# Patient Record
Sex: Male | Born: 1995 | Race: Black or African American | Hispanic: No | Marital: Single | Smoking: Never smoker
Health system: Southern US, Community
[De-identification: ages and names within clinical notes are randomized; demographics above are authoritative.]

---
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
## 2015-10-12 IMAGING — CR DG CHEST 2V
2 series · 2 of 2 positions shown · non-contrast
Comparison: None.

CLINICAL DATA: Left chest pain

EXAM:
CHEST  2 VIEW

[w chest pa]
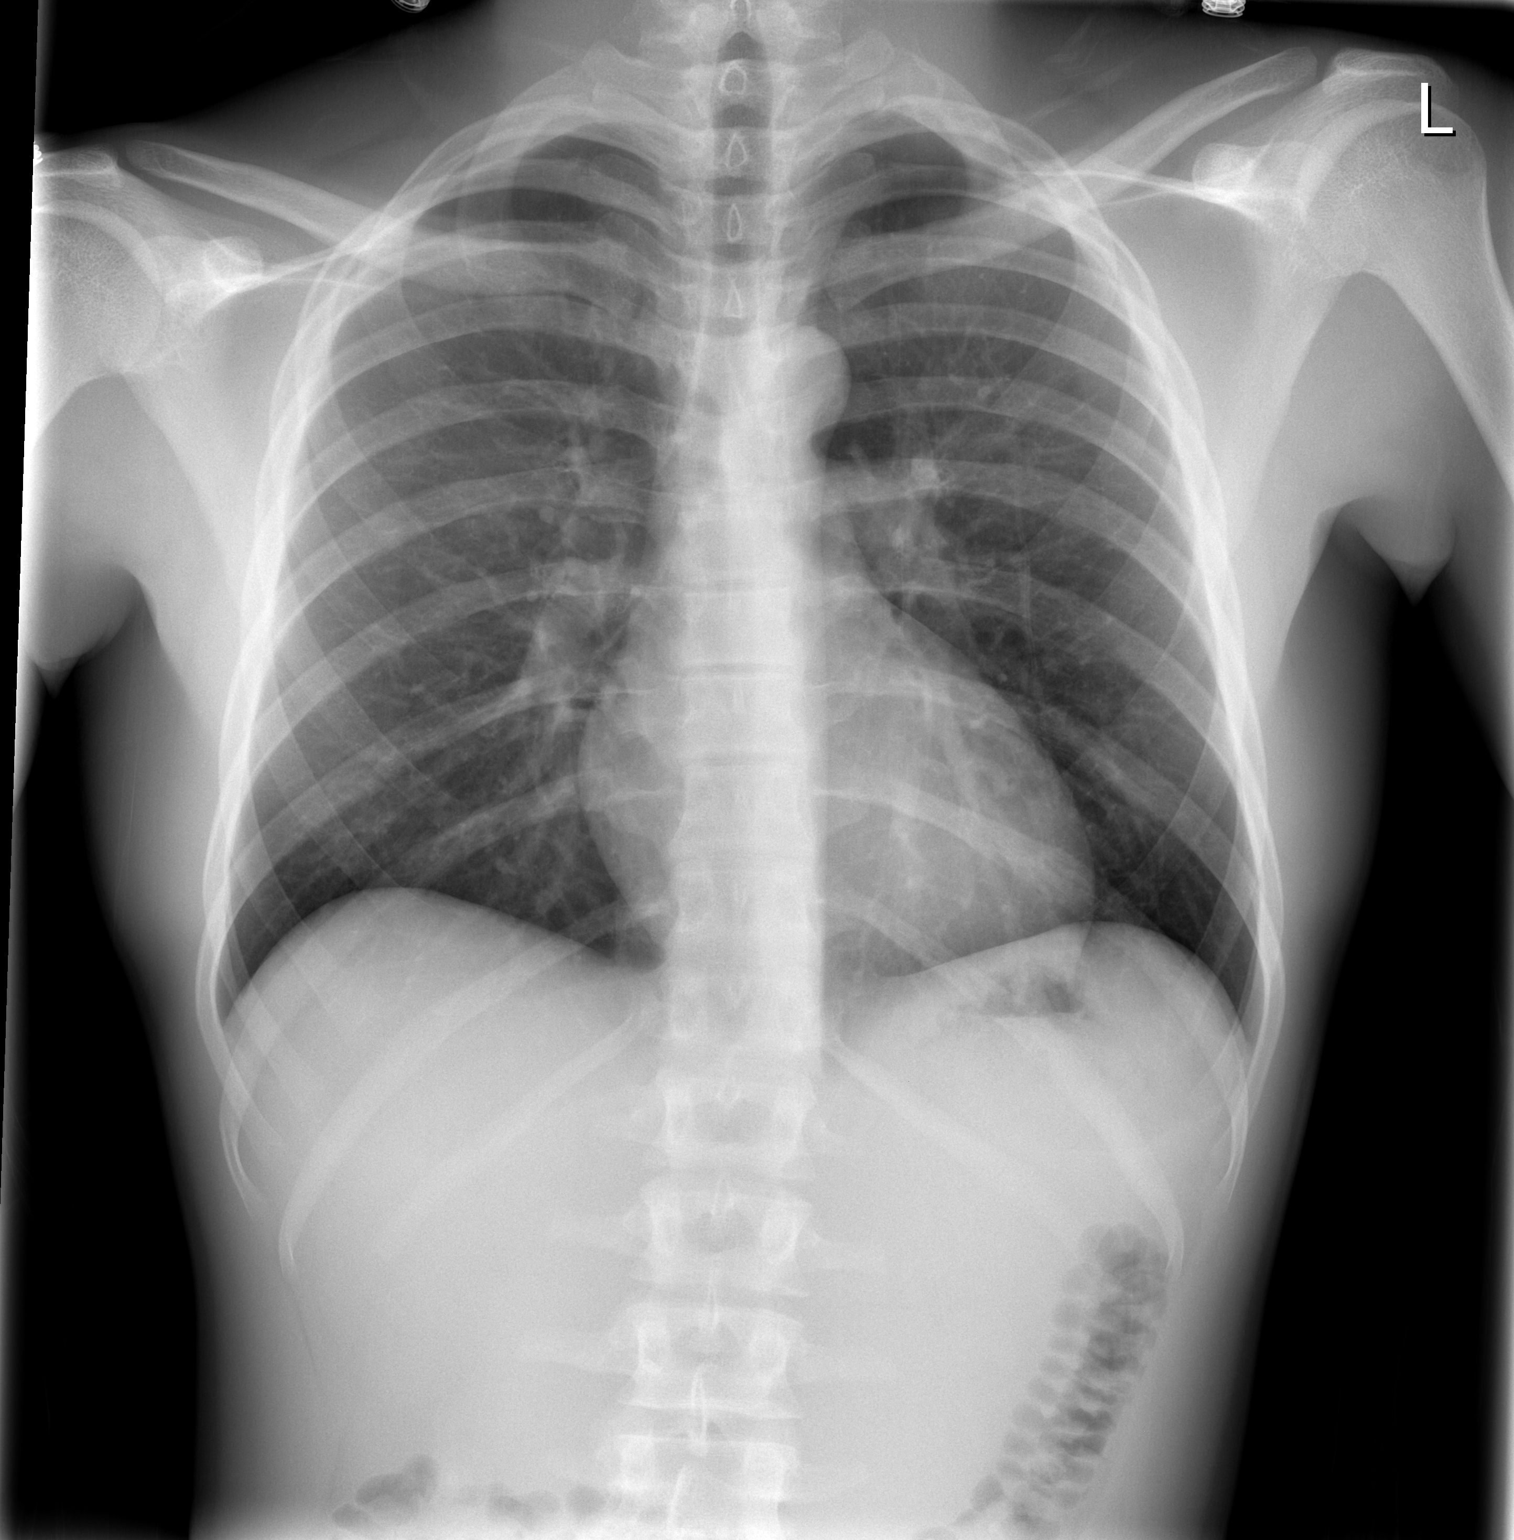

[w chest lat]
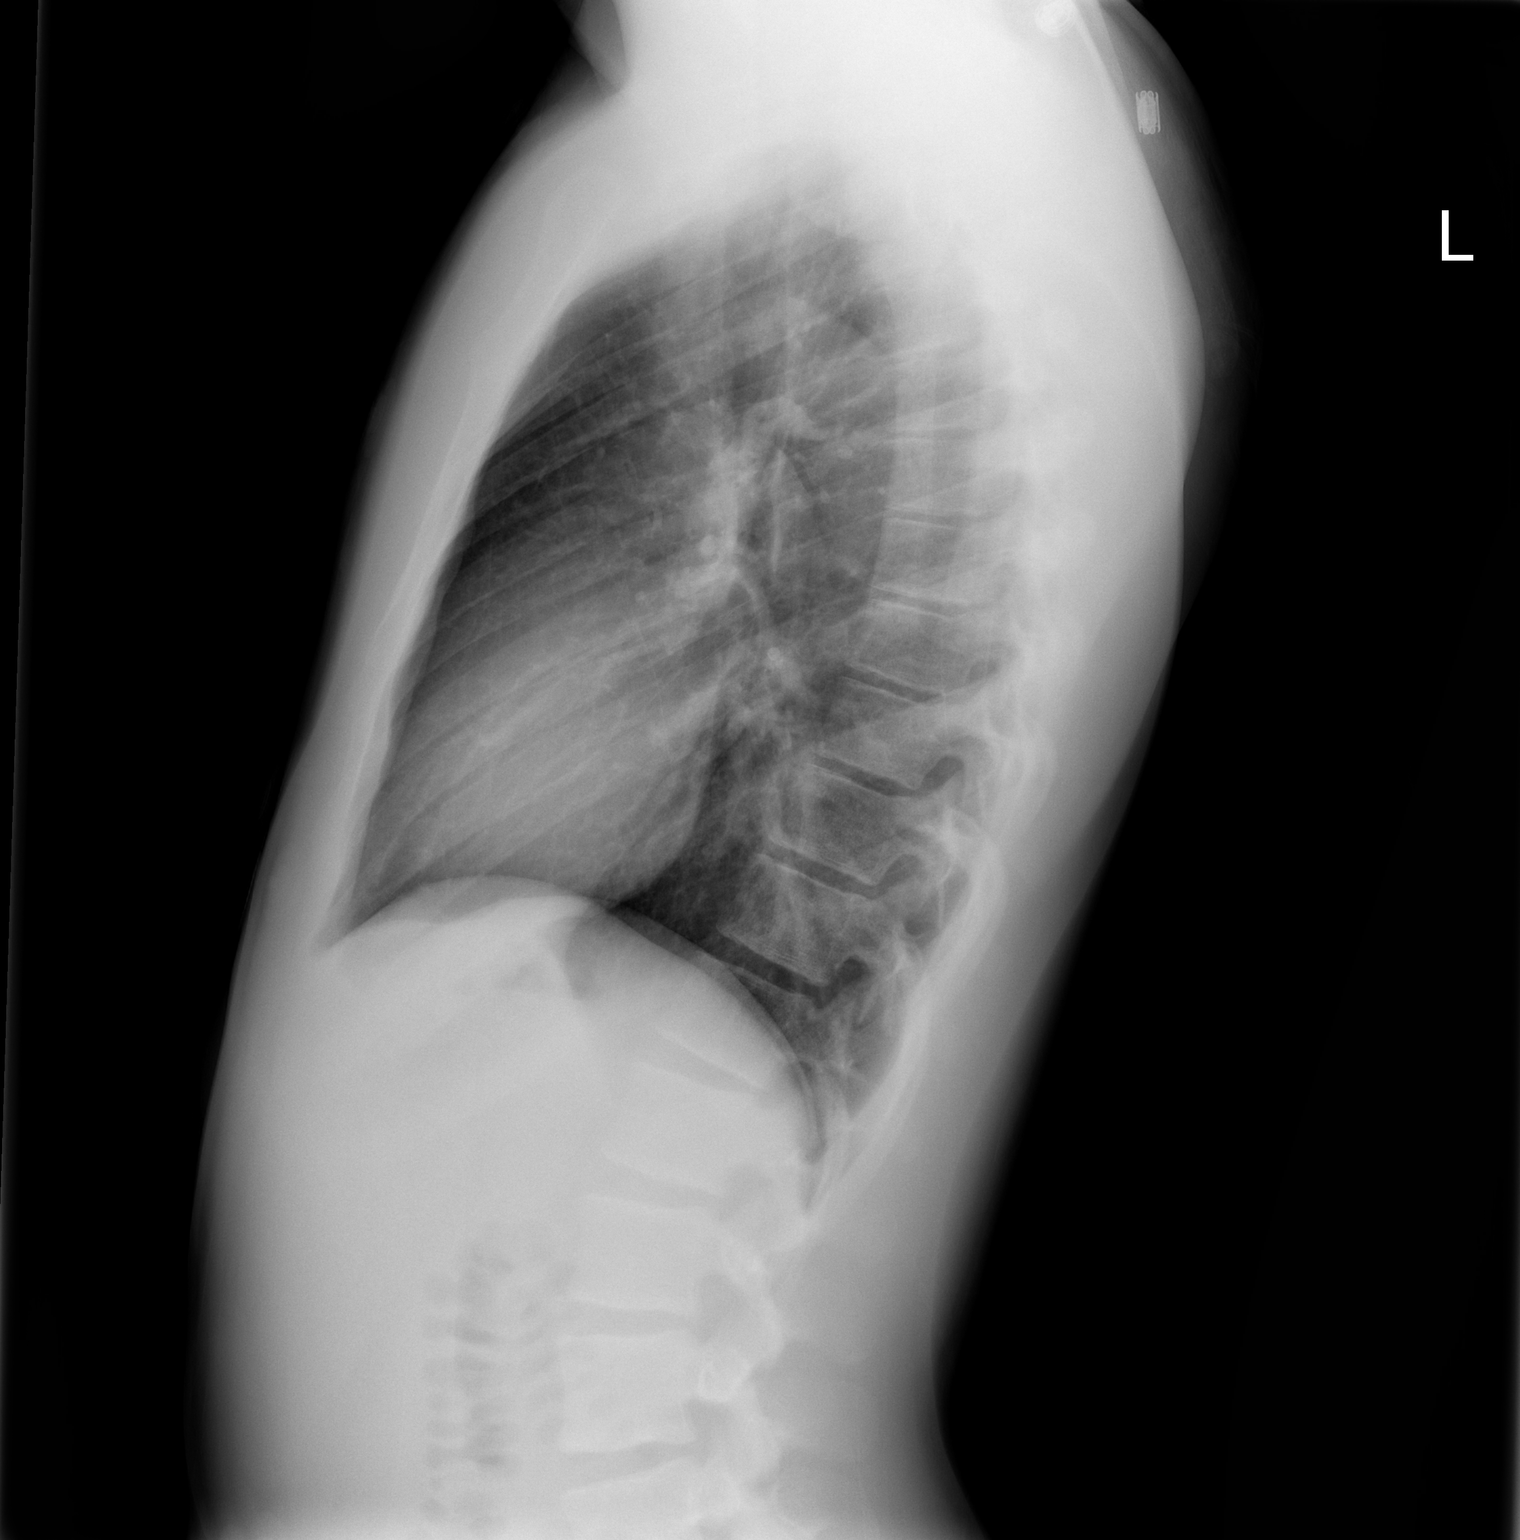

[2 of 2 positions shown; findings below may reference images not displayed]

FINDINGS: Lungs are clear. No pleural effusion or pneumothorax.

The heart is normal in size.

Visualized osseous structures are within normal limits.
IMPRESSION: Normal chest radiographs.

## 2020-08-18 ENCOUNTER — Other Ambulatory Visit: Payer: Self-pay

## 2020-08-18 ENCOUNTER — Emergency Department (HOSPITAL_COMMUNITY)
Admission: EM | Admit: 2020-08-18 | Discharge: 2020-08-18 | Disposition: A | Discharge status: Home / Self Care | Payer: Self-pay | Attending: Emergency Medicine | Admitting: Emergency Medicine

## 2020-08-18 ENCOUNTER — Encounter (HOSPITAL_COMMUNITY): Payer: Self-pay

## 2020-08-18 DIAGNOSIS — H5500 Unspecified nystagmus: Secondary | ICD-10-CM | POA: Insufficient documentation

## 2020-08-18 DIAGNOSIS — R519 Headache, unspecified: Secondary | ICD-10-CM | POA: Insufficient documentation

## 2020-08-18 DIAGNOSIS — H11433 Conjunctival hyperemia, bilateral: Secondary | ICD-10-CM | POA: Insufficient documentation

## 2020-08-18 DIAGNOSIS — F419 Anxiety disorder, unspecified: Secondary | ICD-10-CM | POA: Insufficient documentation

## 2020-08-18 DIAGNOSIS — F1099 Alcohol use, unspecified with unspecified alcohol-induced disorder: Secondary | ICD-10-CM | POA: Insufficient documentation

## 2020-08-18 DIAGNOSIS — F1290 Cannabis use, unspecified, uncomplicated: Secondary | ICD-10-CM | POA: Insufficient documentation

## 2020-08-18 MED ORDER — BUTALBITAL-APAP-CAFFEINE 50-325-40 MG PO TABS: 1.0000 | ORAL_TABLET | Freq: Four times a day (QID) | ORAL | 0 refills | Status: AC | PRN

## 2020-08-18 NOTE — Discharge Instructions (Signed)
You came to the emergency department today to be evaluated for your headache.  Your physical exam was reassuring.  I gave you a prescription for Fioricet.  You may 1 pill as needed every 6 hours for headache.  Do not take more than 6 pills in 1 day.  Do not take more than 3 days in a row.  Follow-up with [facility].  Get help right away if: Your headache becomes severe quickly. Your headache gets worse after moderate to intense physical activity. You have repeated vomiting. You have a stiff neck. You have a loss of vision. You have problems with speech. You have pain in the eye or ear. You have muscular weakness or loss of muscle control. You lose your balance or have trouble walking. You feel faint or pass out. You have confusion. You have a seizure.

## 2020-08-18 NOTE — ED Triage Notes (Signed)
Pt BIB EMS from home. Pt reports head pressure in temporal area. Pt reports recent stress and would like to talk to someone regarding that.

## 2020-08-18 NOTE — ED Provider Notes (Signed)
[facility]-EMERGENCY DEPT Provider Note   CSN: [id number] Arrival date & time: 08/18/20  1636     History Chief Complaint  Patient presents with  . Headache    Steven Shannon is a 25 y.o. male with no past medical history.  Patient presents with complaint of headache.  Patient reports he has been having headaches on and off over the past week.  Patient reports that headaches started suddenly pain is maximal at onset.  Reports pain is to bilateral temporal region of head, patient reports pain gradually improves.  He reports pain feels like a pressure.  Pain is improved with walking around and time.  No aggravating factors.  Patient reports latest episode of pain started around 1400.  At present patient rates pain 2/10 on pain scale.    Endorses associated anxiety.  he denies any associated focal neurological deficits, numbness or tingling to extremities, weakness to extremities, anesthesia, bowel bladder dysfunction, visual disturbance, syncopal episodes, lightheadedness, dizziness, rhinorrhea, eye discharge.  Patient endorses occasional EtOH use, regular marijuana use, denies previous smoking 1/4 pack cigarettes a day.   Reports he smoked marijuana prior headache beginning today.  HPI     History reviewed. No pertinent past medical history.  There are no problems to display for this patient.   History reviewed. No pertinent surgical history.     History reviewed. No pertinent family history.  Social History   Tobacco Use  . Smoking status: Never Smoker  Substance Use Topics  . Alcohol use: No  . Drug use: No    Home Medications Prior to Admission medications   Medication Sig Start Date End Date Taking? Authorizing Provider  EPINEPHrine (EPIPEN) 0.3 mg/0.3 mL SOAJ injection Inject 0.3 mg into the muscle as needed (allergic reaction).    [provider]  ibuprofen (ADVIL,MOTRIN) 200 MG tablet Take 400 mg by mouth once.    [provider]    Allergies    Bee venom  Review of Systems   Review of Systems  Constitutional: Negative for chills and fever.  HENT: Negative for rhinorrhea.   Eyes: Negative for photophobia, discharge and visual disturbance.  Respiratory: Negative for cough and shortness of breath.   Cardiovascular: Negative for chest pain.  Gastrointestinal: Negative for abdominal pain, nausea and vomiting.  Genitourinary: Negative for difficulty urinating.  Musculoskeletal: Negative for back pain, neck pain and neck stiffness.  Skin: Negative for color change and rash.  Neurological: Positive for headaches. Negative for dizziness, tremors, seizures, syncope, facial asymmetry, speech difficulty, weakness, light-headedness and numbness.  Psychiatric/Behavioral: Negative for confusion, hallucinations, self-injury and suicidal ideas. The patient is nervous/anxious.     Physical Exam Updated Vital Signs BP (!) 149/97 (BP Location: Left Arm)   Pulse 94   Temp (!) 97.5 F (36.4 C) (Oral)   Resp 18   Ht 5\' 7"  (1.702 m)   SpO2 100%   BMI 21.51 kg/m   Physical Exam Vitals and nursing note reviewed.  Constitutional:      General: He is not in acute distress.    Appearance: He is not ill-appearing, toxic-appearing or diaphoretic.  HENT:     Head: Normocephalic and atraumatic.     Mouth/Throat:     Pharynx: Uvula midline.  Eyes:     General: No scleral icterus.       Right eye: No discharge.        Left eye: No discharge.     Extraocular Movements: Extraocular movements intact.  Right eye: Nystagmus present.     Left eye: Nystagmus present.     Conjunctiva/sclera:     Right eye: Right conjunctiva is injected.     Left eye: Left conjunctiva is injected.     Pupils: Pupils are equal, round, and reactive to light.     Comments: Horizontal nystagmus  Neck:     Vascular: No carotid bruit.  Cardiovascular:     Rate and Rhythm: Normal rate.     Pulses:          Carotid pulses are 3+ on the right  side and 3+ on the left side.      Radial pulses are 3+ on the right side and 3+ on the left side.     Heart sounds: Normal heart sounds.  Pulmonary:     Effort: Pulmonary effort is normal. No respiratory distress.     Breath sounds: Normal breath sounds. No stridor.  Abdominal:     Palpations: Abdomen is soft.     Tenderness: There is no abdominal tenderness.  Musculoskeletal:     Cervical back: Normal range of motion and neck supple. No rigidity.     Right lower leg: No swelling, deformity, tenderness or bony tenderness. No edema.     Left lower leg: No swelling, deformity, tenderness or bony tenderness. No edema.  Skin:    General: Skin is warm and dry.     Coloration: Skin is not jaundiced or pale.  Neurological:     General: No focal deficit present.     Mental Status: He is alert and oriented to person, place, and time.     GCS: GCS eye subscore is 4. GCS verbal subscore is 5. GCS motor subscore is 6.     Cranial Nerves: No cranial nerve deficit or facial asymmetry.     Sensory: Sensation is intact.     Motor: No weakness, tremor, seizure activity or pronator drift.     Coordination: Romberg sign negative. Finger-Nose-Finger Test normal.     Gait: Gait is intact. Gait and tandem walk normal.     Comments: CN II-XII intact, equal grip strength, +5 strength to bilateral upper and lower extremities     Psychiatric:        Attention and Perception: He is attentive. He does not perceive auditory or visual hallucinations.        Mood and Affect: Mood is anxious.        Speech: Speech normal.        Behavior: Behavior is cooperative.        Thought Content: Thought content is not paranoid or delusional. Thought content does not include homicidal or suicidal ideation. Thought content does not include homicidal or suicidal plan.     ED Results / Procedures / Treatments   Labs (all labs ordered are listed, but only abnormal results are displayed) Labs Reviewed - No data to  display  EKG None  Radiology No results found.  Procedures Procedures   Medications Ordered in ED Medications - No data to display  ED Course  I have reviewed the triage vital signs and the nursing notes.  Pertinent labs & imaging results that were available during my care of the patient were reviewed by me and considered in my medical decision making (see chart for details).    MDM Rules/Calculators/A&P                          Alert  25 year old male no acute stress, nontoxic-appearing.  Patient presents with chief complaint of headache on and off for the past week.  Patient states that headaches are sudden onset with pain not maximal in onset.    On physical exam no neurological deficits noted.  Low suspicion for subarachnoid hemorrhage patient has no neurological deficits after 1 week of described headaches. Most likely tension based in nature.  Will give patient prescription for Fioricet.  Patient follow-up with primary care provider.  Will give patient resources for outpatient counseling for his anxiety.  Patient is agreeable with this plan.  Final Clinical Impression(s) / ED Diagnoses Final diagnoses:  Acute nonintractable headache, unspecified headache type    Rx / DC Orders ED Discharge Orders         Ordered    butalbital-acetaminophen-caffeine (FIORICET) 50-325-40 MG tablet  Every 6 hours PRN        08/18/20 1808           Haskel Schroeder, PA-C 08/19/20 0301    Sabino Donovan, MD 08/20/20 [phone]
